# Patient Record
Sex: Female | Born: 1955 | Race: White | Hispanic: No | Marital: Married | Smoking: Never smoker
Health system: Southern US, Community
[De-identification: ages and names within clinical notes are randomized; demographics above are authoritative.]

## PROBLEM LIST (undated history)

## (undated) DIAGNOSIS — M1990 Unspecified osteoarthritis, unspecified site: Secondary | ICD-10-CM

## (undated) HISTORY — PX: LEFT OOPHORECTOMY: SHX1961

---

## 1980-01-24 HISTORY — PX: DILATION AND CURETTAGE OF UTERUS: SHX78

## 1996-01-24 HISTORY — PX: OVARIAN CYST SURGERY: SHX726

## 2004-10-21 ENCOUNTER — Ambulatory Visit: Payer: Self-pay | Admitting: Family Medicine

## 2004-10-21 ENCOUNTER — Other Ambulatory Visit: Admission: RE | Admit: 2004-10-21 | Discharge: 2004-10-21 | Payer: Self-pay | Admitting: Family Medicine

## 2004-12-05 ENCOUNTER — Ambulatory Visit: Payer: Self-pay | Admitting: Family Medicine

## 2006-07-10 ENCOUNTER — Encounter: Payer: Self-pay | Admitting: Family Medicine

## 2006-07-10 ENCOUNTER — Ambulatory Visit: Payer: Self-pay | Admitting: Family Medicine

## 2006-07-10 ENCOUNTER — Encounter: Admission: RE | Admit: 2006-07-10 | Discharge: 2006-07-10 | Payer: Self-pay | Admitting: Family Medicine

## 2006-07-10 ENCOUNTER — Other Ambulatory Visit: Admission: RE | Admit: 2006-07-10 | Discharge: 2006-07-10 | Payer: Self-pay | Admitting: Family Medicine

## 2006-07-10 DIAGNOSIS — N951 Menopausal and female climacteric states: Secondary | ICD-10-CM | POA: Insufficient documentation

## 2006-07-10 DIAGNOSIS — M79609 Pain in unspecified limb: Secondary | ICD-10-CM | POA: Insufficient documentation

## 2006-07-31 ENCOUNTER — Encounter: Payer: Self-pay | Admitting: Family Medicine

## 2006-07-31 LAB — CONVERTED CEMR LAB
ALT: 27 units/L (ref 0–35)
AST: 28 units/L (ref 0–37)
Alkaline Phosphatase: 101 units/L (ref 39–117)
Calcium: 9.3 mg/dL (ref 8.4–10.5)
Chloride: 102 meq/L (ref 96–112)
Cholesterol, target level: 200 mg/dL
Creatinine, Ser: 0.56 mg/dL (ref 0.40–1.20)
LDL Cholesterol: 128 mg/dL — ABNORMAL HIGH (ref 0–99)
LDL Goal: 160 mg/dL
Total Bilirubin: 0.4 mg/dL (ref 0.3–1.2)
Total CHOL/HDL Ratio: 3.6
VLDL: 10 mg/dL (ref 0–40)

## 2007-01-10 ENCOUNTER — Ambulatory Visit: Payer: Self-pay | Admitting: Family Medicine

## 2007-05-22 ENCOUNTER — Ambulatory Visit: Payer: Self-pay | Admitting: Family Medicine

## 2007-05-22 DIAGNOSIS — J309 Allergic rhinitis, unspecified: Secondary | ICD-10-CM | POA: Insufficient documentation

## 2007-11-13 ENCOUNTER — Ambulatory Visit: Payer: Self-pay | Admitting: Family Medicine

## 2007-11-13 DIAGNOSIS — J209 Acute bronchitis, unspecified: Secondary | ICD-10-CM

## 2007-12-31 ENCOUNTER — Ambulatory Visit: Payer: Self-pay | Admitting: Family Medicine

## 2008-04-27 ENCOUNTER — Ambulatory Visit: Payer: Self-pay | Admitting: Family Medicine

## 2008-04-27 DIAGNOSIS — S0083XA Contusion of other part of head, initial encounter: Secondary | ICD-10-CM

## 2008-04-27 DIAGNOSIS — S1093XA Contusion of unspecified part of neck, initial encounter: Secondary | ICD-10-CM

## 2008-04-27 DIAGNOSIS — S0003XA Contusion of scalp, initial encounter: Secondary | ICD-10-CM | POA: Insufficient documentation

## 2008-04-28 ENCOUNTER — Telehealth: Payer: Self-pay | Admitting: Family Medicine

## 2008-06-01 ENCOUNTER — Encounter: Admission: RE | Admit: 2008-06-01 | Discharge: 2008-06-01 | Payer: Self-pay | Admitting: Family Medicine

## 2008-06-15 ENCOUNTER — Encounter: Admission: RE | Admit: 2008-06-15 | Discharge: 2008-06-15 | Payer: Self-pay | Admitting: Family Medicine

## 2008-06-15 ENCOUNTER — Other Ambulatory Visit: Admission: RE | Admit: 2008-06-15 | Discharge: 2008-06-15 | Payer: Self-pay | Admitting: Family Medicine

## 2008-06-15 ENCOUNTER — Ambulatory Visit: Payer: Self-pay | Admitting: Family Medicine

## 2008-06-15 ENCOUNTER — Encounter: Payer: Self-pay | Admitting: Family Medicine

## 2008-06-15 DIAGNOSIS — N959 Unspecified menopausal and perimenopausal disorder: Secondary | ICD-10-CM | POA: Insufficient documentation

## 2008-06-16 ENCOUNTER — Encounter: Payer: Self-pay | Admitting: Family Medicine

## 2008-06-16 DIAGNOSIS — M899 Disorder of bone, unspecified: Secondary | ICD-10-CM | POA: Insufficient documentation

## 2008-06-16 DIAGNOSIS — M949 Disorder of cartilage, unspecified: Secondary | ICD-10-CM

## 2008-06-16 LAB — CONVERTED CEMR LAB
Alkaline Phosphatase: 86 units/L (ref 39–117)
BUN: 17 mg/dL (ref 6–23)
Cholesterol: 220 mg/dL — ABNORMAL HIGH (ref 0–200)
Creatinine, Ser: 0.68 mg/dL (ref 0.40–1.20)
Glucose, Bld: 95 mg/dL (ref 70–99)
HDL: 58 mg/dL (ref >39)
LDL Cholesterol: 140 mg/dL — ABNORMAL HIGH (ref 0–99)
Total Bilirubin: 0.3 mg/dL (ref 0.3–1.2)
Triglycerides: 112 mg/dL (ref <150)
VLDL: 22 mg/dL (ref 0–40)

## 2008-07-07 ENCOUNTER — Encounter: Payer: Self-pay | Admitting: Family Medicine

## 2008-09-16 ENCOUNTER — Encounter: Payer: Self-pay | Admitting: Family Medicine

## 2011-02-28 ENCOUNTER — Emergency Department (INDEPENDENT_AMBULATORY_CARE_PROVIDER_SITE_OTHER)
Admission: EM | Admit: 2011-02-28 | Discharge: 2011-02-28 | Disposition: A | Discharge status: Home / Self Care | Payer: Managed Care, Other (non HMO) | Source: Home / Self Care | Attending: Emergency Medicine | Admitting: Emergency Medicine

## 2011-02-28 ENCOUNTER — Encounter: Payer: Self-pay | Admitting: *Deleted

## 2011-02-28 DIAGNOSIS — J069 Acute upper respiratory infection, unspecified: Secondary | ICD-10-CM

## 2011-02-28 HISTORY — DX: Unspecified osteoarthritis, unspecified site: M19.90

## 2011-02-28 MED ORDER — LEVOFLOXACIN 500 MG PO TABS: 750.0000 mg | ORAL_TABLET | Freq: Every day | ORAL | Status: AC

## 2011-02-28 MED ORDER — GUAIFENESIN-CODEINE 100-10 MG/5ML PO SYRP: 5.0000 mL | ORAL_SOLUTION | Freq: Four times a day (QID) | ORAL | Status: AC | PRN

## 2011-02-28 NOTE — ED Notes (Signed)
Pt c/o sore throat, productive cough, and fever x 4 days. She has taken nyquil and dayquil.

## 2011-02-28 NOTE — ED Provider Notes (Signed)
History     CSN: [id number]  Arrival date & time 02/28/11  1359   None     No chief complaint on file.   (Consider location/radiation/quality/duration/timing/severity/associated sxs/prior treatment) HPI Sarah Pittman is a 56 y.o. female who complains of onset of cold symptoms for 4 days. + Flu shot.  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 + sore throat + cough No pleuritic pain No wheezing + nasal congestion + post-nasal drainage + sinus pain/pressure + chest congestion No itchy/red eyes No earache No hemoptysis No SOB No chills/sweats + fever (starting today) No nausea No vomiting No abdominal pain No diarrhea No skin rashes + fatigue No myalgias + headache    No past medical history on file.  No past surgical history on file.  No family history on file.  History  Substance Use Topics  . Smoking status: Not on file  . Smokeless tobacco: Not on file  . Alcohol Use: Not on file    OB History    No data available      Review of Systems  Allergies  Meperidine hcl  Home Medications  No current outpatient prescriptions on file.  There were no vitals taken for this visit.  Physical Exam  Nursing note and vitals reviewed. Constitutional: She is oriented to person, place, and time. She appears well-developed and well-nourished.  HENT:  Head: Normocephalic and atraumatic.  Right Ear: Tympanic membrane, external ear and ear canal normal.  Left Ear: Tympanic membrane, external ear and ear canal normal.  Nose: Mucosal edema and rhinorrhea present.  Mouth/Throat: Posterior oropharyngeal erythema present. No oropharyngeal exudate or posterior oropharyngeal edema.  Eyes: No scleral icterus.  Neck: Neck supple.  Cardiovascular: Regular rhythm and normal heart sounds.   Pulmonary/Chest: Breath sounds normal. Accessory muscle usage present. No respiratory distress. She has no decreased breath sounds. She has no wheezes. She has no rhonchi.  Neurological: She is  alert and oriented to person, place, and time.  Skin: Skin is warm and dry.  Psychiatric: She has a normal mood and affect. Her speech is normal.    ED Course  Procedures (including critical care time)  Labs Reviewed - No data to display No results found.   No diagnosis found.    MDM  1)  Take the prescribed antibiotic as instructed.  Rapid strep negative.  If worsening, CXR and CBC.  She opted to postpone those for now.  I chose Levaquin since I believe she is still worsening and requires something stronger than a Z-Pak. 2)  Use nasal saline solution (over the counter) at least 3 times a day. 3)  Use over the counter decongestants like Zyrtec-D every 12 hours as needed to help with congestion.  If you have hypertension, do not take medicines with sudafed.  4)  Can take tylenol every 6 hours or motrin every 8 hours for pain or fever. 5)  Follow up with your primary doctor if no improvement in 5-7 days, sooner if increasing pain, fever, or new symptoms.     Lily Kocher, MD 02/28/11 [phone]

## 2012-01-09 ENCOUNTER — Other Ambulatory Visit (HOSPITAL_COMMUNITY)
Admission: RE | Admit: 2012-01-09 | Discharge: 2012-01-09 | Disposition: A | Discharge status: Home / Self Care | Payer: Managed Care, Other (non HMO) | Source: Ambulatory Visit | Attending: Family Medicine | Admitting: Family Medicine

## 2012-01-09 ENCOUNTER — Encounter: Payer: Self-pay | Admitting: Family Medicine

## 2012-01-09 ENCOUNTER — Ambulatory Visit (INDEPENDENT_AMBULATORY_CARE_PROVIDER_SITE_OTHER): Payer: Managed Care, Other (non HMO) | Admitting: Family Medicine

## 2012-01-09 VITALS — BP 97/63 | HR 84 | Ht 60.0 in | Wt 115.0 lb

## 2012-01-09 DIAGNOSIS — Z01419 Encounter for gynecological examination (general) (routine) without abnormal findings: Secondary | ICD-10-CM | POA: Insufficient documentation

## 2012-01-09 DIAGNOSIS — E785 Hyperlipidemia, unspecified: Secondary | ICD-10-CM

## 2012-01-09 DIAGNOSIS — Z23 Encounter for immunization: Secondary | ICD-10-CM

## 2012-01-09 DIAGNOSIS — K5900 Constipation, unspecified: Secondary | ICD-10-CM

## 2012-01-09 DIAGNOSIS — Z1231 Encounter for screening mammogram for malignant neoplasm of breast: Secondary | ICD-10-CM

## 2012-01-09 DIAGNOSIS — Z8262 Family history of osteoporosis: Secondary | ICD-10-CM

## 2012-01-09 NOTE — Patient Instructions (Addendum)
Keep up a regular exercise program and make sure you are eating a healthy diet Try to eat 4 servings of dairy a day, or if you are lactose intolerant take a calcium with vitamin D daily.  Your vaccines are up to date.   

## 2012-01-09 NOTE — Progress Notes (Signed)
  Subjective:     Sarah Pittman is a 56 y.o. female and is here for a comprehensive physical exam. She is also here to reestablish care. She was last seen in May of 2010 as a patient of Dr. Clydie Braun Bowen's. The patient reports problems - occ constipation.  Says has been using miralax occassionally.  Says tries ot eat more veggies.  . She does not get any regular exercise and admits that she feels like this would help her bowels. No worsening or alleviating symptoms.  History   Social History  . Marital Status: Married    Spouse Name: N/A    Number of Children: N/A  . Years of Education: N/A   Occupational History  . Not on file.   Social History Main Topics  . Smoking status: Never Smoker   . Smokeless tobacco: Not on file  . Alcohol Use: No  . Drug Use: No  . Sexually Active:    Other Topics Concern  . Not on file   Social History Narrative  . No narrative on file   Health Maintenance  Topic Date Due  . Pap Smear  10/15/1972  . Tetanus/tdap  10/15/1973  . Colonoscopy  10/15/2004  . Mammogram  06/02/2010  . Influenza Vaccine  09/24/2011    The following portions of the patient's history were reviewed and updated as appropriate: allergies, current medications, past family history, past medical history, past social history, past surgical history and problem list.  Review of Systems A comprehensive review of systems was negative.   Objective:    BP 97/63  Pulse 84  Ht 5' (1.524 m)  Wt 115 lb (52.164 kg)  BMI 22.46 kg/m2 General appearance: alert, cooperative and appears stated age Head: Normocephalic, without obvious abnormality, atraumatic Eyes: conj clear, EOMI, PEERLA Ears: normal TM's and external ear canals both ears Nose: Nares normal. Septum midline. Mucosa normal. No drainage or sinus tenderness. Throat: lips, mucosa, and tongue normal; teeth and gums normal Neck: no adenopathy, no carotid bruit, no JVD, supple, symmetrical, trachea midline and thyroid not  enlarged, symmetric, no tenderness/mass/nodules Back: symmetric, no curvature. ROM normal. No CVA tenderness. Lungs: clear to auscultation bilaterally Breasts: normal appearance, no masses or tenderness Heart: regular rate and rhythm, S1, S2 normal, no murmur, click, rub or gallop Abdomen: soft, non-tender; bowel sounds normal; no masses,  no organomegaly Pelvic: cervix normal in appearance, external genitalia normal, no adnexal masses or tenderness, no cervical motion tenderness, rectovaginal septum normal, uterus normal size, shape, and consistency and vagina normal without discharge, cervix was stenotic.  Extremities: extremities normal, atraumatic, no cyanosis or edema Pulses: 2+ and symmetric Skin: Skin color, texture, turgor normal. No rashes or lesions Lymph nodes: Cervical, supraclavicular, and axillary nodes normal. Neurologic: Grossly normal    Assessment:    Healthy female exam.   Plan:     See After Visit Summary for Counseling Recommendations  Keep up a regular exercise program and make sure you are eating a healthy diet Try to eat 4 servings of dairy a day, or if you are lactose intolerant take a calcium with vitamin D daily.  Your vaccines are up to date.  Handout provided about shingles vaccine so she can check with her insurance to see if it is covered. Tdap given today.  Hyperlipidemia - Recheck lipids.    Constipation - Check TSH. iNcrease fluids, acitivity and fiber in diet. Ok to use miralax.

## 2012-01-10 ENCOUNTER — Encounter: Payer: Self-pay | Admitting: Family Medicine

## 2012-01-23 ENCOUNTER — Ambulatory Visit (INDEPENDENT_AMBULATORY_CARE_PROVIDER_SITE_OTHER): Payer: Managed Care, Other (non HMO)

## 2012-01-23 DIAGNOSIS — Z1231 Encounter for screening mammogram for malignant neoplasm of breast: Secondary | ICD-10-CM

## 2012-01-23 DIAGNOSIS — M949 Disorder of cartilage, unspecified: Secondary | ICD-10-CM

## 2012-01-23 DIAGNOSIS — Z8262 Family history of osteoporosis: Secondary | ICD-10-CM

## 2012-01-23 LAB — LIPID PANEL
HDL: 55 mg/dL (ref >39)
LDL Cholesterol: 126 mg/dL — ABNORMAL HIGH (ref 0–99)

## 2012-01-23 LAB — TSH: TSH: 1.578 u[IU]/mL (ref 0.350–4.500)

## 2012-01-24 LAB — COMPLETE METABOLIC PANEL WITH GFR
CO2: 30 mEq/L (ref 19–32)
Calcium: 9.9 mg/dL (ref 8.4–10.5)
Chloride: 98 mEq/L (ref 96–112)
Creat: 0.7 mg/dL (ref 0.50–1.10)
GFR, Est African American: >89 mL/min
GFR, Est Non African American: >89 mL/min
Glucose, Bld: 83 mg/dL (ref 70–99)
Sodium: 135 mEq/L (ref 135–145)
Total Bilirubin: 0.7 mg/dL (ref 0.3–1.2)
Total Protein: 6.7 g/dL (ref 6.0–8.3)

## 2012-01-25 ENCOUNTER — Other Ambulatory Visit: Payer: Self-pay | Admitting: Family Medicine

## 2012-01-25 MED ORDER — CALCIUM-VITAMIN D 500-200 MG-UNIT PO TABS: 1.0000 | ORAL_TABLET | Freq: Two times a day (BID) | ORAL | Status: DC

## 2014-10-30 ENCOUNTER — Encounter: Payer: Managed Care, Other (non HMO) | Admitting: Family Medicine

## 2014-11-27 ENCOUNTER — Telehealth: Payer: Self-pay | Admitting: *Deleted

## 2014-11-27 ENCOUNTER — Ambulatory Visit (INDEPENDENT_AMBULATORY_CARE_PROVIDER_SITE_OTHER): Payer: Managed Care, Other (non HMO) | Admitting: Family Medicine

## 2014-11-27 ENCOUNTER — Other Ambulatory Visit: Payer: Self-pay | Admitting: Family Medicine

## 2014-11-27 ENCOUNTER — Encounter: Payer: Self-pay | Admitting: Family Medicine

## 2014-11-27 ENCOUNTER — Other Ambulatory Visit (HOSPITAL_COMMUNITY)
Admission: RE | Admit: 2014-11-27 | Discharge: 2014-11-27 | Disposition: A | Discharge status: Home / Self Care | Payer: Managed Care, Other (non HMO) | Source: Ambulatory Visit | Attending: Family Medicine | Admitting: Family Medicine

## 2014-11-27 VITALS — BP 124/73 | HR 82 | Ht 60.0 in | Wt 112.3 lb

## 2014-11-27 DIAGNOSIS — Z114 Encounter for screening for human immunodeficiency virus [HIV]: Secondary | ICD-10-CM

## 2014-11-27 DIAGNOSIS — Z124 Encounter for screening for malignant neoplasm of cervix: Secondary | ICD-10-CM

## 2014-11-27 DIAGNOSIS — M79671 Pain in right foot: Secondary | ICD-10-CM

## 2014-11-27 DIAGNOSIS — Z0189 Encounter for other specified special examinations: Secondary | ICD-10-CM

## 2014-11-27 DIAGNOSIS — Z1231 Encounter for screening mammogram for malignant neoplasm of breast: Secondary | ICD-10-CM

## 2014-11-27 DIAGNOSIS — R1031 Right lower quadrant pain: Secondary | ICD-10-CM | POA: Diagnosis not present

## 2014-11-27 DIAGNOSIS — Z1151 Encounter for screening for human papillomavirus (HPV): Secondary | ICD-10-CM | POA: Diagnosis not present

## 2014-11-27 DIAGNOSIS — Z01411 Encounter for gynecological examination (general) (routine) with abnormal findings: Secondary | ICD-10-CM | POA: Insufficient documentation

## 2014-11-27 DIAGNOSIS — Z1159 Encounter for screening for other viral diseases: Secondary | ICD-10-CM

## 2014-11-27 DIAGNOSIS — S90851A Superficial foreign body, right foot, initial encounter: Secondary | ICD-10-CM

## 2014-11-27 DIAGNOSIS — Z0000 Encounter for general adult medical examination without abnormal findings: Secondary | ICD-10-CM

## 2014-11-27 NOTE — Progress Notes (Signed)
Subjective:     Sarah RegesDeborah R Pittman is a 59 y.o. female and is here for a comprehensive physical exam. The patient reports problems - right "ovary" pain that started on 9/11 and then went away on 10/1.  then came back about a week later. she had her left ovary removed. She is postmenopausal and does not menstruate any longer.  Lesion on right foot for several week.   She thinks she may have gotten something stuck in her foot. She's not sure if it was class or not. She said she saw dark black spot in the middle. She tried to get out herself. And she started using a compounded tight wart remover to try to get it to go away. But is still very tender and she feels like something is still sharp sticking in that area. If on the ball of her foot.  Social History   Social History  . Marital Status: Married    Spouse Name: Onalee HuaDavid   . Number of Children: 2  . Years of Education: N/A   Occupational History  . Purchaser     Social History Main Topics  . Smoking status: Never Smoker   . Smokeless tobacco: Not on file  . Alcohol Use: No  . Drug Use: No  . Sexual Activity:    Partners: Male   Other Topics Concern  . Not on file   Social History Narrative   No exercise.     Health Maintenance  Topic Date Due  . Hepatitis C Screening  [birth year]  . HIV Screening  10/[birth year]/1969  . MAMMOGRAM  01/22/2014  . INFLUENZA VACCINE  08/24/2014  . ZOSTAVAX  10/16/2014  . PAP SMEAR  01/09/2015  . COLONOSCOPY  01/23/2018  . TETANUS/TDAP  01/08/2022    The following portions of the patient's history were reviewed and updated as appropriate: allergies, current medications, past family history, past medical history, past social history, past surgical history and problem list.  Review of Systems Pertinent items noted in HPI and remainder of comprehensive ROS otherwise negative.   Objective:    BP 124/73 mmHg  Pulse 82  Ht 5' (1.524 m)  Wt 112 lb 4.8 oz (50.939 kg)  BMI 21.93 kg/m2 General  appearance: alert, cooperative and appears stated age Head: Normocephalic, without obvious abnormality, atraumatic Eyes: conj clear, EOMI, PEERLA Ears: normal TM's and external ear canals both ears Nose: Nares normal. Septum midline. Mucosa normal. No drainage or sinus tenderness. Throat: lips, mucosa, and tongue normal; teeth and gums normal Neck: no adenopathy, no carotid bruit, no JVD, supple, symmetrical, trachea midline and thyroid not enlarged, symmetric, no tenderness/mass/nodules Back: symmetric, no curvature. ROM normal. No CVA tenderness. Lungs: clear to auscultation bilaterally Breasts: normal appearance, no masses or tenderness Heart: regular rate and rhythm, S1, S2 normal, no murmur, click, rub or gallop Abdomen: soft, non-tender; bowel sounds normal; no masses,  no organomegaly Pelvic: cervix normal in appearance, external genitalia normal, no adnexal masses or tenderness, no cervical motion tenderness, rectovaginal septum normal, uterus normal size, shape, and consistency and vagina normal without discharge Extremities: extremities normal, atraumatic, no cyanosis or edema Pulses: 2+ and symmetric Skin: Skin color, texture, turgor normal. No rashes or lesions, approx 1 cm round area of white tissue on the ball of the right foot.  Lymph nodes: Cervical, supraclavicular, and axillary nodes normal. Neurologic: Alert and oriented X 3, normal strength and tone. Normal symmetric reflexes. Normal coordination and gait    Assessment:    Healthy female  exam.     Plan:     See After Visit Summary for Counseling Recommendations  Keep up a regular exercise program and make sure you are eating a healthy diet Try to eat 4 servings of dairy a day, or if you are lactose intolerant take a calcium with vitamin D daily.  Your vaccines are up to date.   Right lower quadrant pain- exam is normal. Will schedule for [place].    possible foreign body in the ball of the right foot-encouraged her to  let it heal over the next couple weeks she has been using a wart remover and the skin just is completely white on the surface. And then come back and we can try to take a look  and see if we might be able to remove anything. Also consider x-ray to see if there's actual glass of the foot.  Given information about shingles vaccine.      discussed need for mammogram. She's actually thinking about having a thermogram done. Certainly her but is not considered standard of care and we discussed that today.

## 2014-11-27 NOTE — Telephone Encounter (Signed)
Order for xray placed.Sarah Pittman, Sarah Pittman

## 2014-11-27 NOTE — Patient Instructions (Signed)
Keep up a regular exercise program and make sure you are eating a healthy diet Try to eat 4 servings of dairy a day, or if you are lactose intolerant take a calcium with vitamin D daily.  Your vaccines are up to date.   

## 2014-12-02 ENCOUNTER — Other Ambulatory Visit: Payer: Self-pay | Admitting: Family Medicine

## 2014-12-02 ENCOUNTER — Ambulatory Visit (INDEPENDENT_AMBULATORY_CARE_PROVIDER_SITE_OTHER): Payer: Managed Care, Other (non HMO)

## 2014-12-02 DIAGNOSIS — Z90721 Acquired absence of ovaries, unilateral: Secondary | ICD-10-CM

## 2014-12-02 DIAGNOSIS — M79671 Pain in right foot: Secondary | ICD-10-CM | POA: Diagnosis not present

## 2014-12-02 DIAGNOSIS — M19071 Primary osteoarthritis, right ankle and foot: Secondary | ICD-10-CM

## 2014-12-02 DIAGNOSIS — S90851A Superficial foreign body, right foot, initial encounter: Secondary | ICD-10-CM

## 2014-12-02 DIAGNOSIS — Z1231 Encounter for screening mammogram for malignant neoplasm of breast: Secondary | ICD-10-CM | POA: Diagnosis not present

## 2014-12-02 DIAGNOSIS — Z780 Asymptomatic menopausal state: Secondary | ICD-10-CM

## 2014-12-02 DIAGNOSIS — R1031 Right lower quadrant pain: Secondary | ICD-10-CM | POA: Diagnosis not present

## 2014-12-02 LAB — CYTOLOGY - PAP

## 2014-12-03 ENCOUNTER — Encounter: Payer: Self-pay | Admitting: Family Medicine

## 2014-12-03 LAB — COMPLETE METABOLIC PANEL WITH GFR
ALBUMIN: 4.5 g/dL (ref 3.6–5.1)
ALT: 17 U/L (ref 6–29)
AST: 22 U/L (ref 10–35)
Alkaline Phosphatase: 76 U/L (ref 33–130)
BUN: 11 mg/dL (ref 7–25)
CALCIUM: 9.5 mg/dL (ref 8.6–10.4)
CO2: 31 mmol/L (ref 20–31)
CREATININE: 0.59 mg/dL (ref 0.50–0.99)
Chloride: 99 mmol/L (ref 98–110)
Glucose, Bld: 82 mg/dL (ref 65–99)
Potassium: 3.9 mmol/L (ref 3.5–5.3)
Sodium: 138 mmol/L (ref 135–146)
TOTAL PROTEIN: 6.8 g/dL (ref 6.1–8.1)
Total Bilirubin: 0.8 mg/dL (ref 0.2–1.2)

## 2014-12-03 LAB — TSH: TSH: 1.763 u[IU]/mL (ref 0.350–4.500)

## 2014-12-03 LAB — LIPID PANEL
Cholesterol: 188 mg/dL (ref 125–200)
HDL: 64 mg/dL (ref >=46)
LDL CALC: 106 mg/dL (ref <130)
TRIGLYCERIDES: 92 mg/dL (ref <150)
Total CHOL/HDL Ratio: 2.9 Ratio (ref <=5.0)
VLDL: 18 mg/dL (ref <30)

## 2014-12-03 LAB — HIV ANTIBODY (ROUTINE TESTING W REFLEX): HIV 1&2 Ab, 4th Generation: NONREACTIVE

## 2014-12-03 LAB — HEPATITIS C ANTIBODY: HCV Ab: NEGATIVE

## 2014-12-11 ENCOUNTER — Ambulatory Visit: Payer: Managed Care, Other (non HMO) | Admitting: Family Medicine

## 2017-10-25 IMAGING — US US PELVIS COMPLETE
1 series · 13 of 25 positions shown · non-contrast
Comparison: None in PACs

CLINICAL DATA: Intermittent right lower quadrant pain for the past
month, history of left oophorectomy. The patient is postmenopausal.

EXAM:
TRANSABDOMINAL AND TRANSVAGINAL ULTRASOUND OF PELVIS
TECHNIQUE: Both transabdominal and transvaginal ultrasound examinations of the
pelvis were performed. Transabdominal technique was performed for
global imaging of the pelvis including uterus, ovaries, adnexal
regions, and pelvic cul-de-sac. It was necessary to proceed with
endovaginal exam following the transabdominal exam to visualize the
endometrium and right ovary.

[Series 1: us pelvis complete · 0.17mm/px · 13 of 54 slices shown]
[im 1/54]
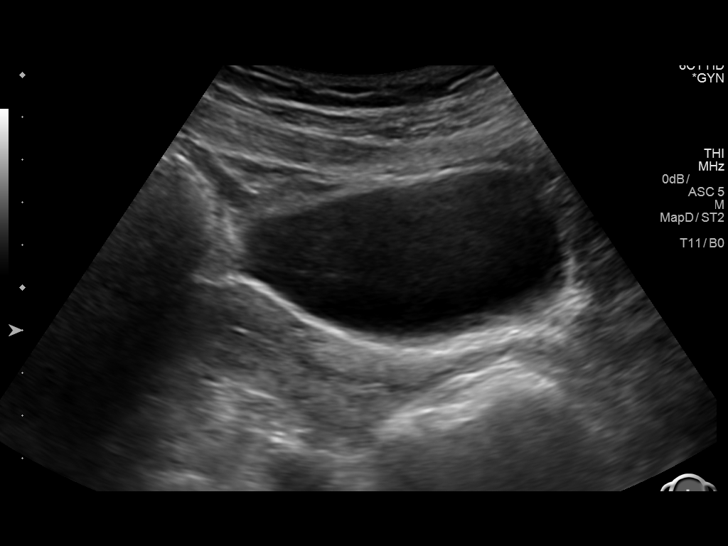
[im 5/54]
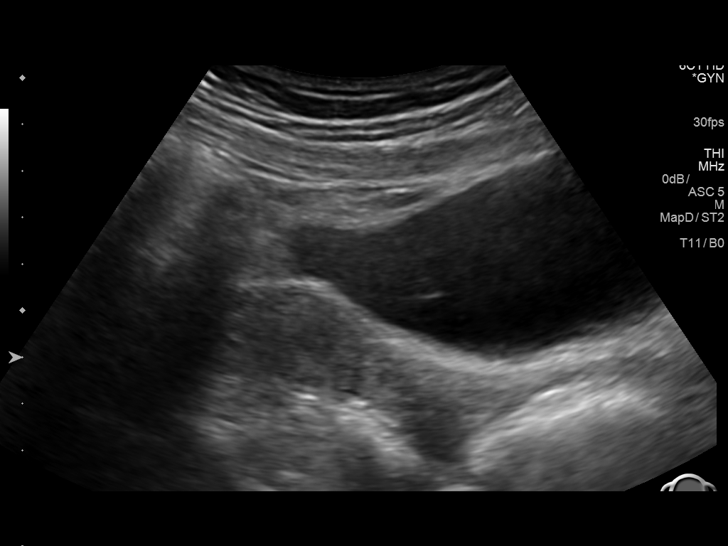
[im 9/54]
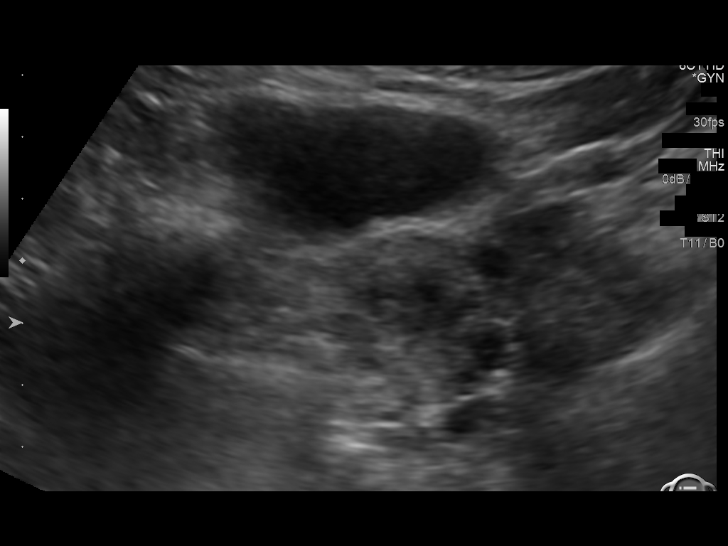
[im 14/54]
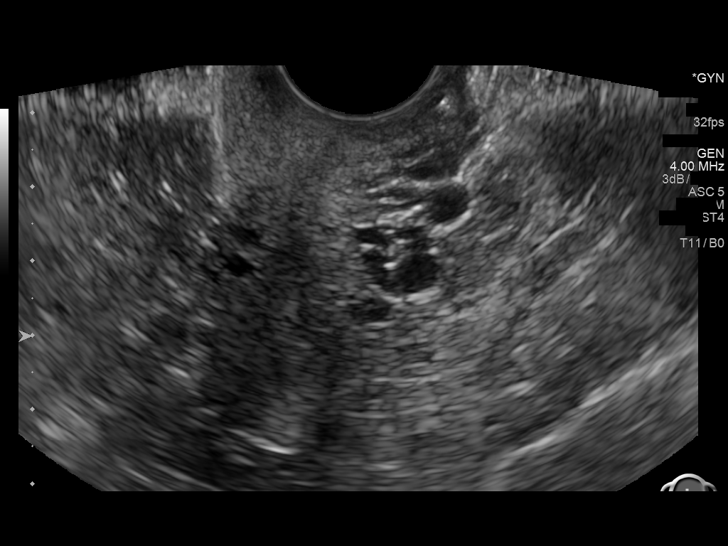
[im 18/54]
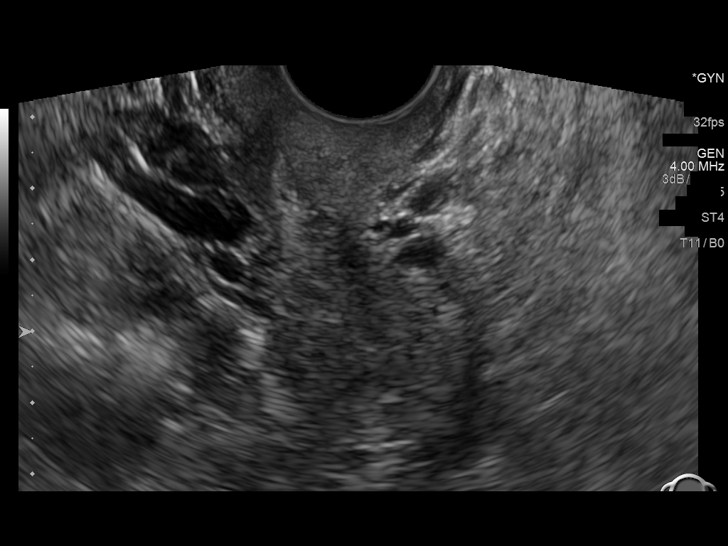
[im 23/54]
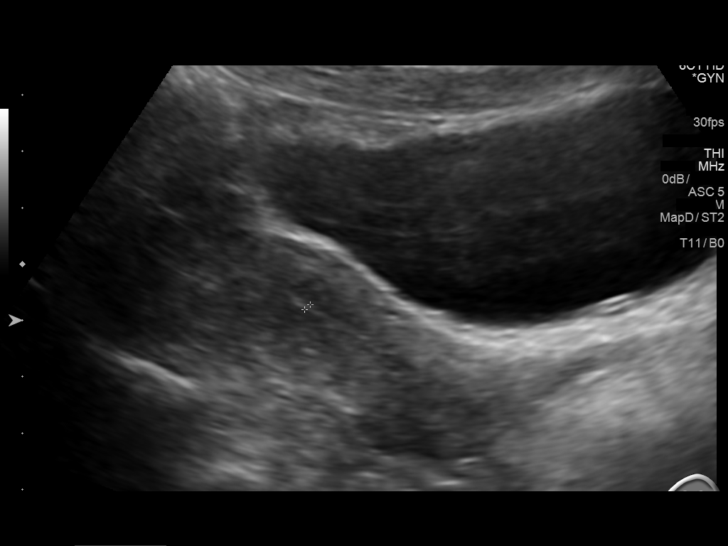
[im 27/54]
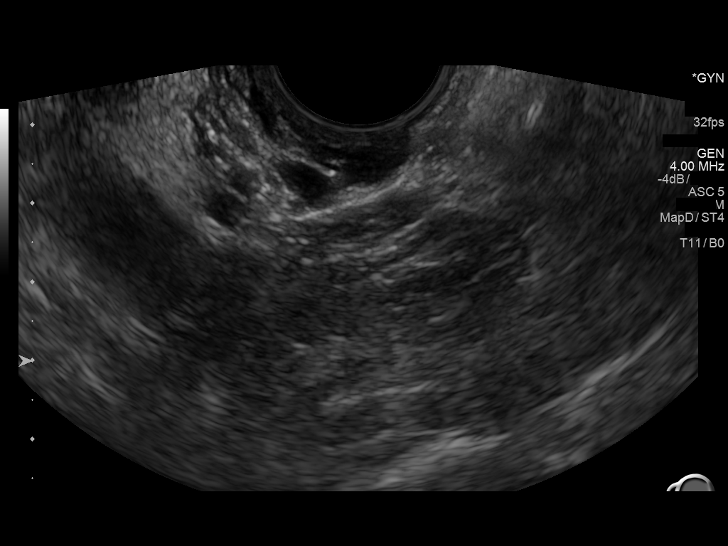
[im 31/54]
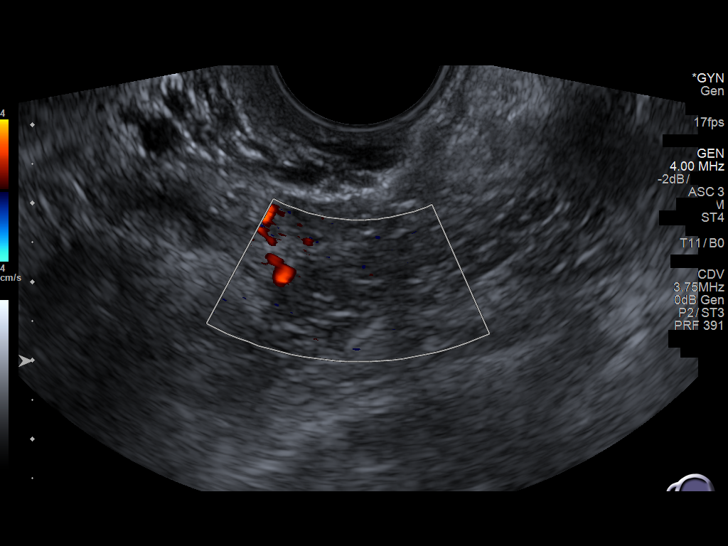
[im 36/54]
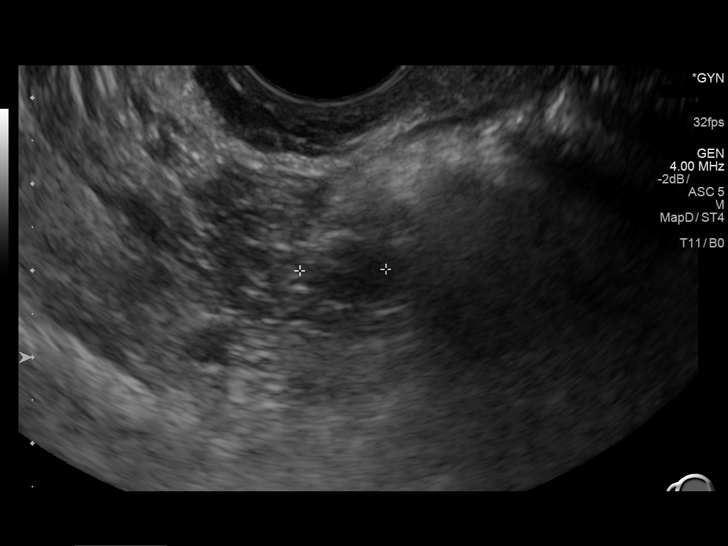
[im 40/54]
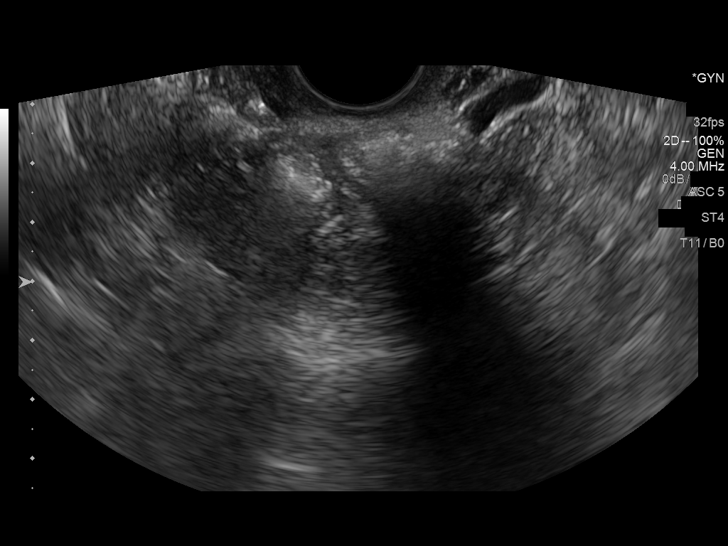
[im 45/54]
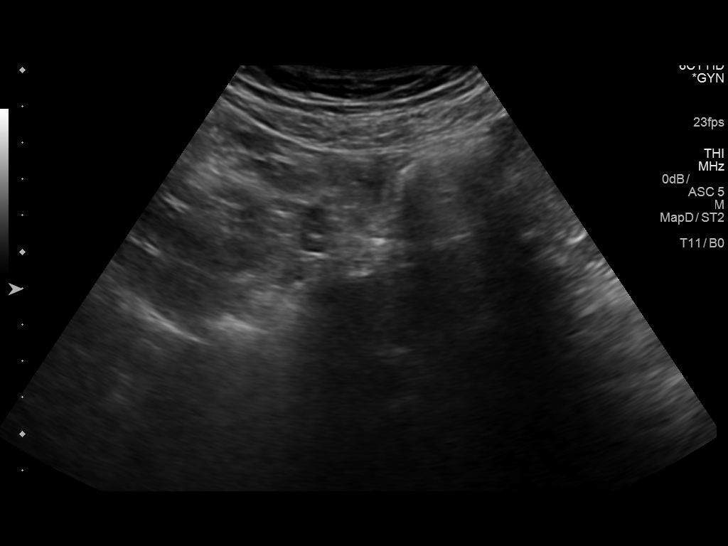
[im 49/54]
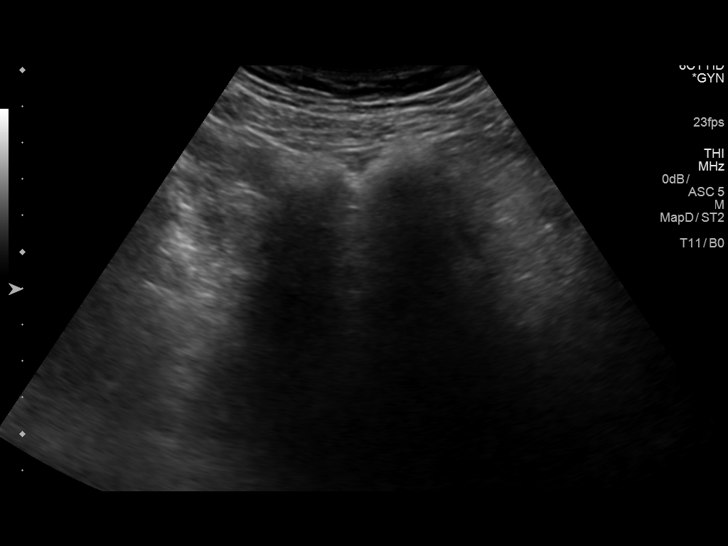
[im 54/54]
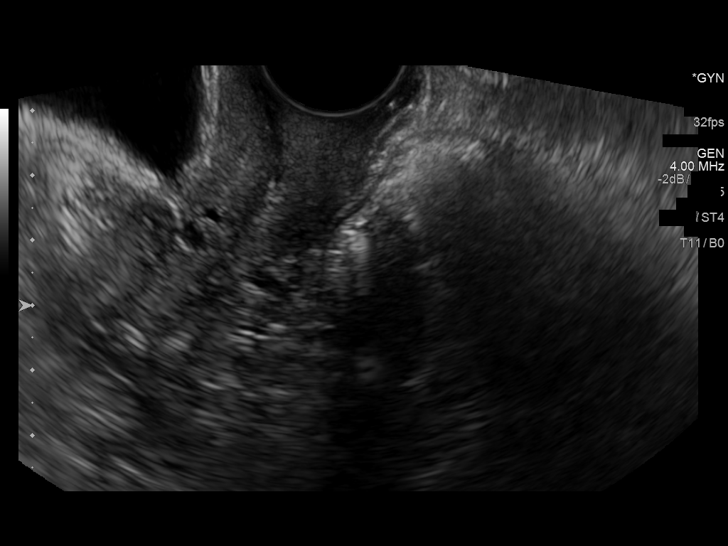

[13 of 25 positions shown; findings below may reference images not displayed]

FINDINGS: Excessive bowel gas and stool limits evaluation of the adnexal
regions.

Uterus

Measurements: 5.2 x 2.2 x 2.7 cm. No fibroids or other mass
visualized.

Endometrium

Thickness: 1.3 mm.  No focal abnormality visualized.

Right ovary

Measurements: 1.4 x 1.0 x 1.0 cm. Normal appearance/no adnexal mass.

Left ovary

The left ovary is surgically absent. No left adnexal mass was
observed.

Other findings

No free fluid.
IMPRESSION: 1. The uterus and right ovary are normal in appearance. The left
ovary surgically absent.
2. No pelvic mass or free fluid is observed.
3. If the patient's symptoms persists, abdominal and pelvic CT
scanning would be a useful next imaging technique.
